# Patient Record
Sex: Female | Born: 2005 | Race: White | Hispanic: No | Marital: Single
Health system: Southern US, Community
[De-identification: ages and names within clinical notes are randomized; demographics above are authoritative.]

## PROBLEM LIST (undated history)

## (undated) ENCOUNTER — Inpatient Hospital Stay: Payer: Self-pay

## (undated) DIAGNOSIS — B974 Respiratory syncytial virus as the cause of diseases classified elsewhere: Secondary | ICD-10-CM

## (undated) DIAGNOSIS — B338 Other specified viral diseases: Secondary | ICD-10-CM

---
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
## 2024-01-16 NOTE — Consults (Signed)
 Patient seen for consult, see Lactation Note  [name], RN 01/16/24. 4:13 PM
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
